# Patient Record
Sex: Male | Born: 1985 | Race: Black or African American | Hispanic: No | Marital: Single | Smoking: Former smoker
Health system: Southern US, Community
[De-identification: ages and names within clinical notes are randomized; demographics above are authoritative.]

## PROBLEM LIST (undated history)

## (undated) DIAGNOSIS — K5900 Constipation, unspecified: Secondary | ICD-10-CM

## (undated) HISTORY — PX: BIOPSY BOWEL: PRO7

## (undated) HISTORY — DX: Constipation, unspecified: K59.00

## (undated) HISTORY — PX: LYMPH NODE BIOPSY: SHX201

---

## 2008-02-11 ENCOUNTER — Ambulatory Visit (HOSPITAL_COMMUNITY): Admission: RE | Admit: 2008-02-11 | Discharge: 2008-02-11 | Payer: Self-pay | Admitting: Urology

## 2008-02-15 ENCOUNTER — Ambulatory Visit (HOSPITAL_COMMUNITY): Admission: RE | Admit: 2008-02-15 | Discharge: 2008-02-15 | Payer: Self-pay | Admitting: Urology

## 2008-02-25 ENCOUNTER — Ambulatory Visit (HOSPITAL_COMMUNITY): Admission: RE | Admit: 2008-02-25 | Discharge: 2008-02-25 | Payer: Self-pay | Admitting: Urology

## 2008-02-29 ENCOUNTER — Encounter (INDEPENDENT_AMBULATORY_CARE_PROVIDER_SITE_OTHER): Payer: Self-pay | Admitting: Interventional Radiology

## 2008-02-29 ENCOUNTER — Ambulatory Visit (HOSPITAL_COMMUNITY): Admission: RE | Admit: 2008-02-29 | Discharge: 2008-02-29 | Payer: Self-pay | Admitting: Urology

## 2008-03-08 ENCOUNTER — Ambulatory Visit (HOSPITAL_COMMUNITY): Payer: Self-pay | Admitting: Oncology

## 2008-03-26 ENCOUNTER — Ambulatory Visit (HOSPITAL_COMMUNITY): Admission: RE | Admit: 2008-03-26 | Discharge: 2008-03-26 | Payer: Self-pay | Admitting: Interventional Radiology

## 2008-04-26 ENCOUNTER — Encounter (HOSPITAL_COMMUNITY): Admission: RE | Admit: 2008-04-26 | Discharge: 2008-05-26 | Payer: Self-pay | Admitting: Oncology

## 2008-04-28 ENCOUNTER — Ambulatory Visit (HOSPITAL_COMMUNITY): Payer: Self-pay | Admitting: Oncology

## 2008-07-03 ENCOUNTER — Ambulatory Visit (HOSPITAL_COMMUNITY): Payer: Self-pay | Admitting: Oncology

## 2008-07-03 ENCOUNTER — Encounter (HOSPITAL_COMMUNITY): Admission: RE | Admit: 2008-07-03 | Discharge: 2008-07-26 | Payer: Self-pay | Admitting: Oncology

## 2008-07-09 ENCOUNTER — Emergency Department (HOSPITAL_COMMUNITY): Admission: EM | Admit: 2008-07-09 | Discharge: 2008-07-09 | Payer: Self-pay | Admitting: Emergency Medicine

## 2008-10-31 ENCOUNTER — Encounter (HOSPITAL_COMMUNITY): Admission: RE | Admit: 2008-10-31 | Discharge: 2008-11-30 | Payer: Self-pay | Admitting: Oncology

## 2008-11-01 ENCOUNTER — Ambulatory Visit (HOSPITAL_COMMUNITY): Admission: RE | Admit: 2008-11-01 | Discharge: 2008-11-01 | Payer: Self-pay | Admitting: Oncology

## 2008-11-03 ENCOUNTER — Ambulatory Visit (HOSPITAL_COMMUNITY): Payer: Self-pay | Admitting: Oncology

## 2008-11-07 ENCOUNTER — Ambulatory Visit: Payer: Self-pay | Admitting: Pulmonary Disease

## 2008-11-07 DIAGNOSIS — R599 Enlarged lymph nodes, unspecified: Secondary | ICD-10-CM | POA: Insufficient documentation

## 2008-11-08 ENCOUNTER — Ambulatory Visit: Payer: Self-pay | Admitting: Internal Medicine

## 2008-11-20 ENCOUNTER — Encounter: Payer: Self-pay | Admitting: Pulmonary Disease

## 2008-11-22 ENCOUNTER — Telehealth (INDEPENDENT_AMBULATORY_CARE_PROVIDER_SITE_OTHER): Payer: Self-pay | Admitting: *Deleted

## 2008-11-24 ENCOUNTER — Ambulatory Visit: Payer: Self-pay | Admitting: Pulmonary Disease

## 2008-11-24 DIAGNOSIS — F411 Generalized anxiety disorder: Secondary | ICD-10-CM

## 2008-11-24 DIAGNOSIS — J309 Allergic rhinitis, unspecified: Secondary | ICD-10-CM | POA: Insufficient documentation

## 2008-11-27 ENCOUNTER — Telehealth: Payer: Self-pay | Admitting: Pulmonary Disease

## 2008-12-19 ENCOUNTER — Telehealth (INDEPENDENT_AMBULATORY_CARE_PROVIDER_SITE_OTHER): Payer: Self-pay | Admitting: *Deleted

## 2009-03-26 ENCOUNTER — Telehealth: Payer: Self-pay | Admitting: Pulmonary Disease

## 2009-04-09 ENCOUNTER — Telehealth: Payer: Self-pay | Admitting: Pulmonary Disease

## 2009-04-13 ENCOUNTER — Ambulatory Visit: Payer: Self-pay | Admitting: Internal Medicine

## 2010-08-18 ENCOUNTER — Encounter (HOSPITAL_COMMUNITY): Payer: Self-pay | Admitting: Oncology

## 2010-09-16 IMAGING — CT CT CHEST W/ CM
2 of 4 series · 15 of 36 positions shown, 18 images · IV contrast (agent unspecified)
Comparison: CT abdomen pelvis 11/01/2008

CLINICAL DATA: Cough, chest pain and shortness of breath with
exertion.  Mediastinal adenopathy.

CT CHEST WITH CONTRAST
TECHNIQUE: Multidetector CT imaging of the chest was performed
following the standard protocol during bolus administration of
intravenous contrast.
Contrast: 80 ml [id number]

[Series 2: chest routine with · axial · 0.78mm/px · z∈[-343,-48]mm · 12 of 71 slices shown, 15 images]
[im 6/71  mediastinal]
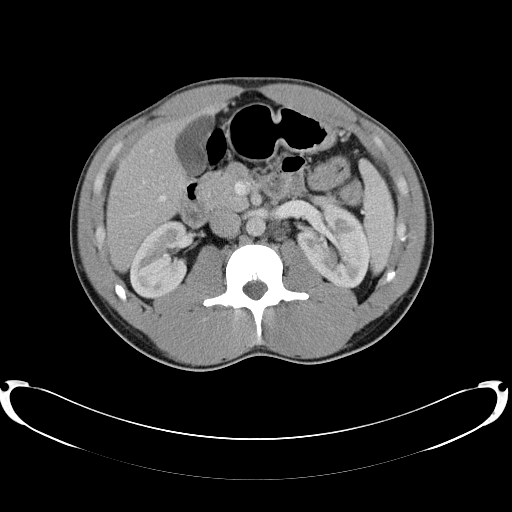
[im 6/71  lung]
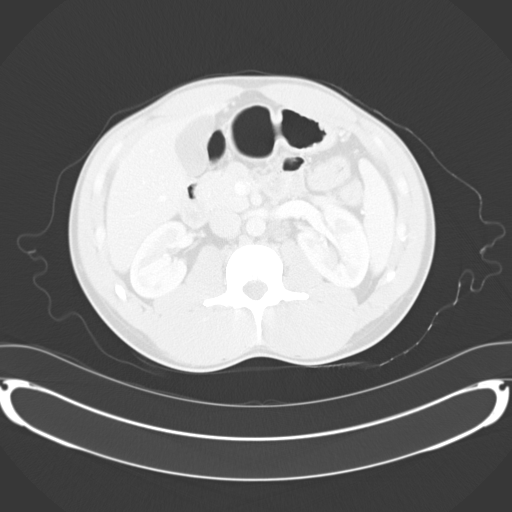
[im 11/71  lung]
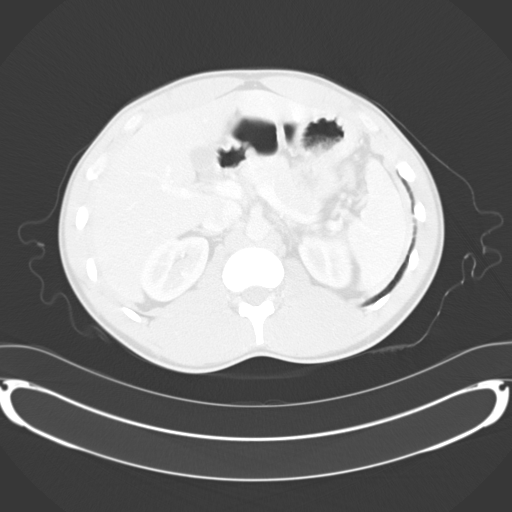
[im 17/71  lung]
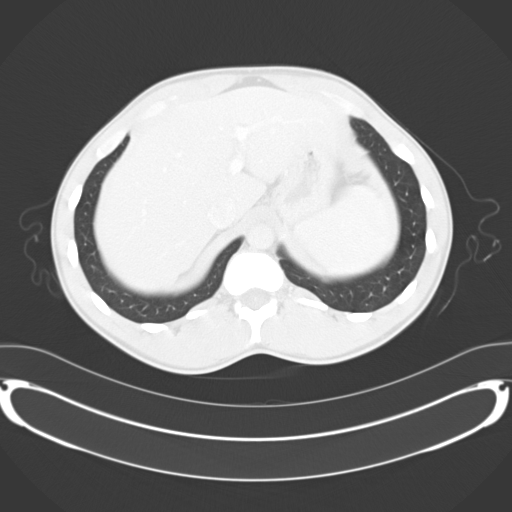
[im 22/71  lung]
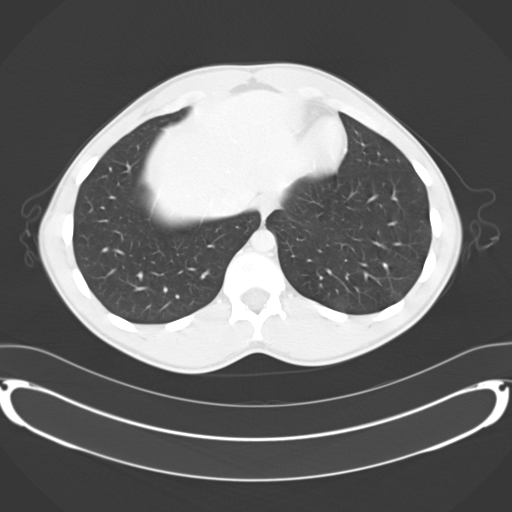
[im 27/71  mediastinal]
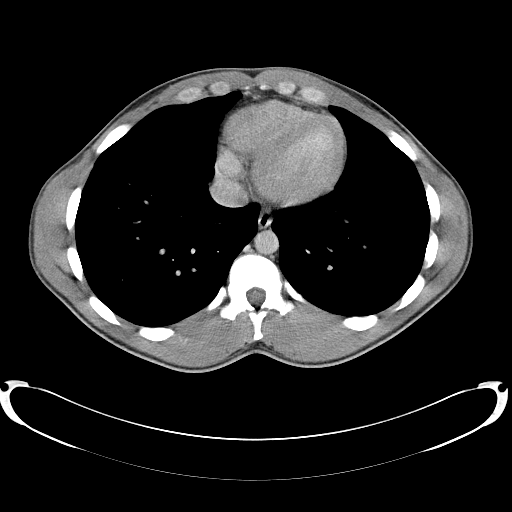
[im 27/71  lung]
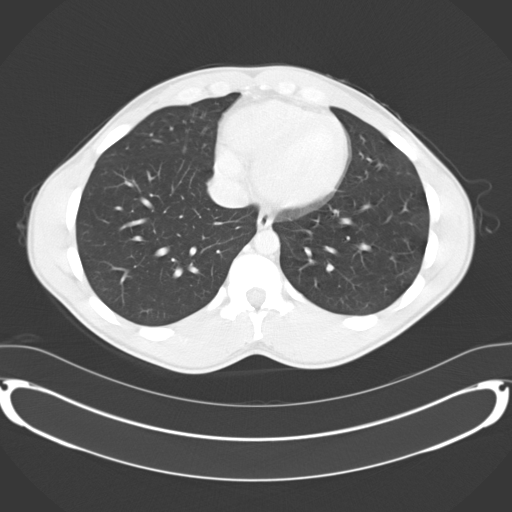
[im 33/71  lung]
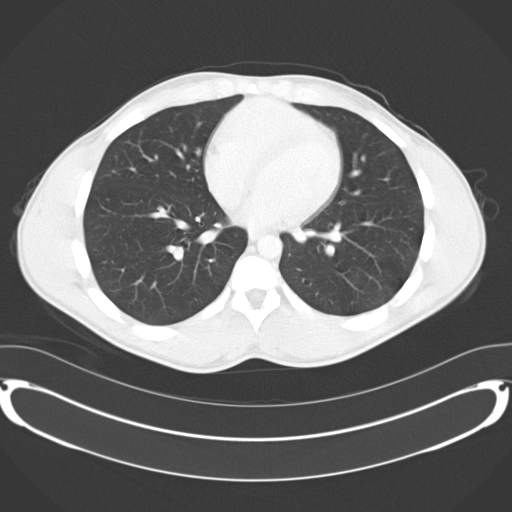
[im 38/71  lung]
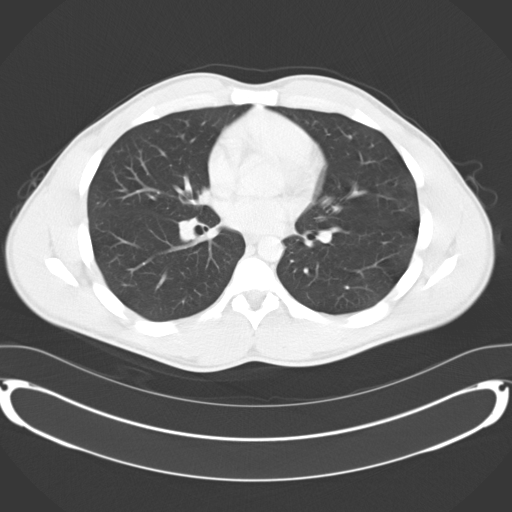
[im 44/71  lung]
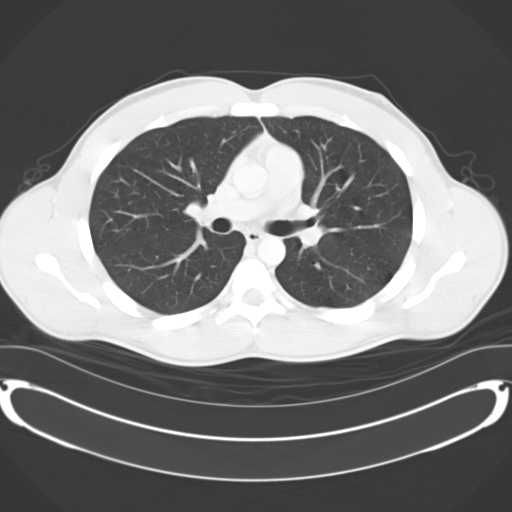
[im 49/71  mediastinal]
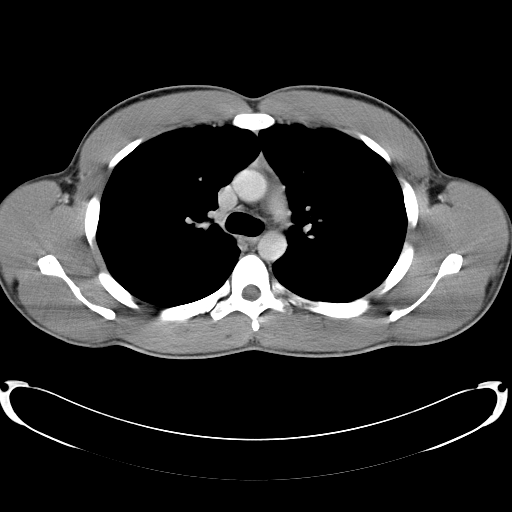
[im 49/71  lung]
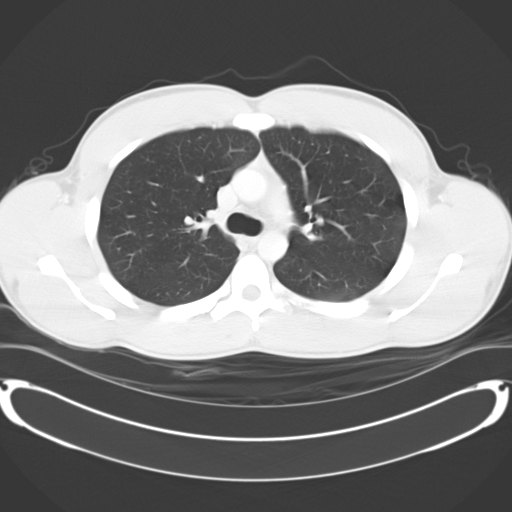
[im 54/71  lung]
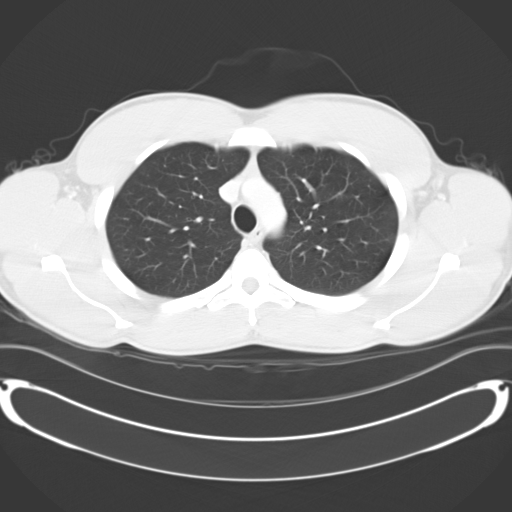
[im 60/71  lung]
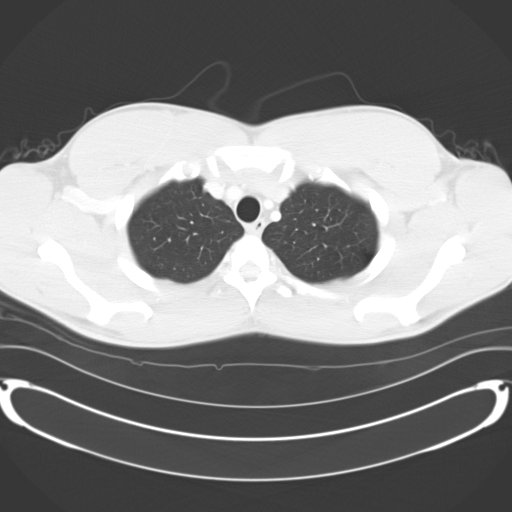
[im 65/71  lung]
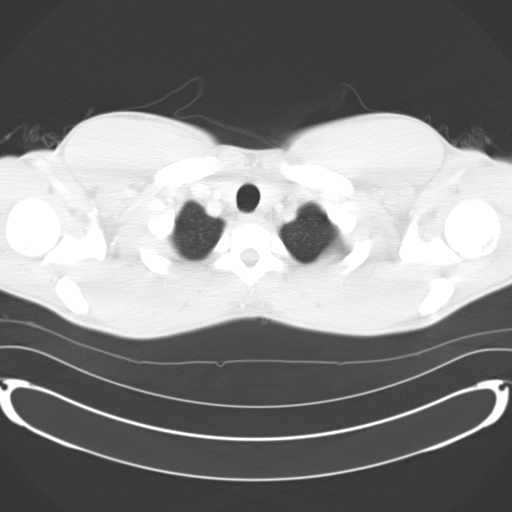

[Series 602: <mpr range> · coronal · 0.78mm/px · 3 of 120 slices shown]
[im 24/120  lung]
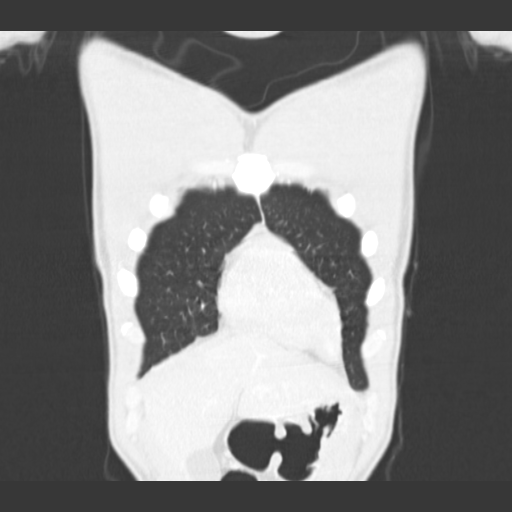
[im 48/120  lung]
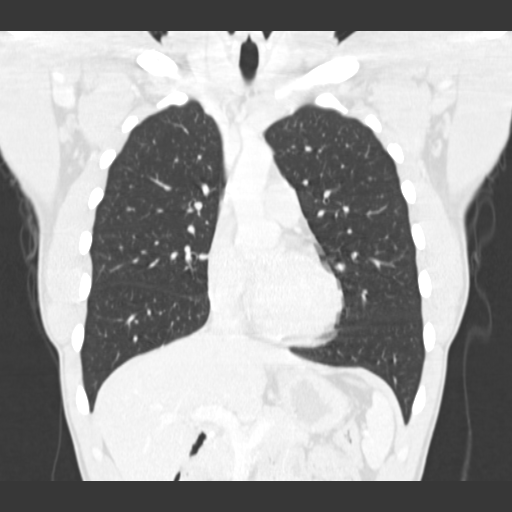
[im 72/120  lung]
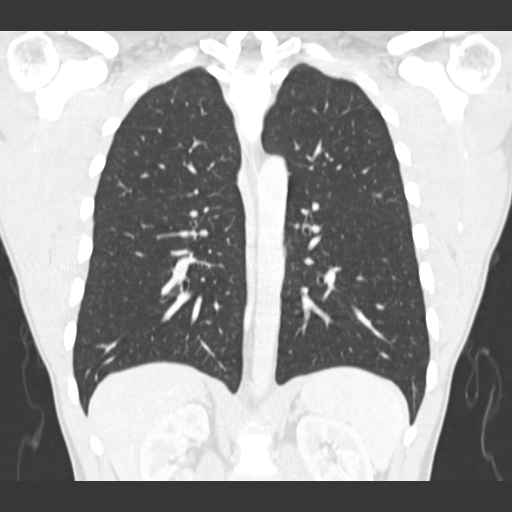

[15 of 36 positions shown; findings below may reference images not displayed]

FINDINGS: No pathologically enlarged mediastinal, hilar or axillary
lymph nodes.  Heart size normal.  No pericardial effusion.

Lungs are clear.  No pleural fluid.  Airway is unremarkable.

Incidental imaging of the upper abdomen shows small lymph nodes.
No worrisome lytic or sclerotic lesions.
IMPRESSION: No acute findings in the chest.

## 2010-11-06 LAB — COMPREHENSIVE METABOLIC PANEL
AST: 25 U/L (ref 0–37)
BUN: 9 mg/dL (ref 6–23)
CO2: 27 mEq/L (ref 19–32)
Calcium: 9.3 mg/dL (ref 8.4–10.5)
Chloride: 102 mEq/L (ref 96–112)
Creatinine, Ser: 1.11 mg/dL (ref 0.4–1.5)
GFR calc non Af Amer: >60 mL/min (ref >60)
Glucose, Bld: 99 mg/dL (ref 70–99)
Total Bilirubin: 1.1 mg/dL (ref 0.3–1.2)

## 2010-11-06 LAB — CBC
HCT: 39.7 % (ref 39.0–52.0)
Hemoglobin: 13.6 g/dL (ref 13.0–17.0)
MCHC: 34.4 g/dL (ref 30.0–36.0)
MCV: 88.7 fL (ref 78.0–100.0)
RBC: 4.47 MIL/uL (ref 4.22–5.81)
WBC: 4 10*3/uL (ref 4.0–10.5)

## 2010-11-06 LAB — DIFFERENTIAL
Basophils Absolute: 0 10*3/uL (ref 0.0–0.1)
Eosinophils Relative: 2 % (ref 0–5)
Lymphocytes Relative: 45 % (ref 12–46)
Neutro Abs: 1.7 10*3/uL (ref 1.7–7.7)
Neutrophils Relative %: 44 % (ref 43–77)

## 2010-11-06 LAB — LACTATE DEHYDROGENASE: LDH: 114 U/L (ref 94–250)

## 2010-12-10 NOTE — Op Note (Signed)
NAMERAEDEN, SCHIPPERS               ACCOUNT NO.:  [id number]   MEDICAL RECORD NO.:  [id number]          PATIENT TYPE:  AMB   LOCATION:  DAY                           FACILITY:  [facility]   PHYSICIAN:  Ky Barban, M.D.DATE OF BIRTH:  1986-03-05   DATE OF PROCEDURE:  DATE OF DISCHARGE:                               OPERATIVE REPORT   PREOPERATIVE DIAGNOSIS:  Gross hematuria.   POSTOPERATIVE DIAGNOSIS:  Posterior urethritis.   PROCEDURE:  Cystoscopy.   ANESTHESIA:  General.   PROCEDURE:  The patient under general endotracheal anesthesia in  lithotomy position after the usual prep and drape, a #25 cystoscope is  introduced under direct vision. Anterior urethra looks normal, posterior  urethra is open, and it is friable, looks erythematous.  Bladder is  smooth, no tumor, stone, foreign body, or inflammation.  Cystoscope was  removed.  The patient left the operating room in satisfactory condition.      Ky Barban, M.D.  Electronically Signed     MIJ/MEDQ  D:  02/25/2008  T:  02/26/2008  Job:  [id number]

## 2011-04-25 LAB — URINALYSIS, ROUTINE W REFLEX MICROSCOPIC
Bilirubin Urine: NEGATIVE
Nitrite: NEGATIVE
Specific Gravity, Urine: 1.02
Urobilinogen, UA: 0.2

## 2011-04-25 LAB — CBC
HCT: 42.9
MCV: 91.5
Platelets: 182
RDW: 12.3

## 2011-04-25 LAB — HEMOGLOBIN AND HEMATOCRIT, BLOOD: HCT: 39.7

## 2011-05-02 LAB — COMPREHENSIVE METABOLIC PANEL
Alkaline Phosphatase: 72 U/L (ref 39–117)
BUN: 10 mg/dL (ref 6–23)
Calcium: 9.1 mg/dL (ref 8.4–10.5)
Glucose, Bld: 106 mg/dL — ABNORMAL HIGH (ref 70–99)
Potassium: 3.9 mEq/L (ref 3.5–5.1)
Total Protein: 7 g/dL (ref 6.0–8.3)

## 2011-05-02 LAB — LACTATE DEHYDROGENASE: LDH: 96 U/L (ref 94–250)

## 2011-05-02 LAB — CBC
HCT: 40.6 % (ref 39.0–52.0)
Hemoglobin: 13.8 g/dL (ref 13.0–17.0)
MCHC: 33.9 g/dL (ref 30.0–36.0)
RDW: 11.8 % (ref 11.5–15.5)

## 2011-05-02 LAB — DIFFERENTIAL
Basophils Relative: 0 % (ref 0–1)
Monocytes Relative: 7 % (ref 3–12)
Neutro Abs: 1.9 10*3/uL (ref 1.7–7.7)
Neutrophils Relative %: 44 % (ref 43–77)

## 2012-05-27 ENCOUNTER — Encounter (HOSPITAL_COMMUNITY): Payer: Self-pay | Admitting: Emergency Medicine

## 2012-05-27 ENCOUNTER — Emergency Department (HOSPITAL_COMMUNITY)
Admission: EM | Admit: 2012-05-27 | Discharge: 2012-05-27 | Disposition: A | Discharge status: Home / Self Care | Payer: BC Managed Care – PPO | Attending: Emergency Medicine | Admitting: Emergency Medicine

## 2012-05-27 DIAGNOSIS — W261XXA Contact with sword or dagger, initial encounter: Secondary | ICD-10-CM | POA: Insufficient documentation

## 2012-05-27 DIAGNOSIS — W260XXA Contact with knife, initial encounter: Secondary | ICD-10-CM | POA: Insufficient documentation

## 2012-05-27 DIAGNOSIS — S61209A Unspecified open wound of unspecified finger without damage to nail, initial encounter: Secondary | ICD-10-CM | POA: Insufficient documentation

## 2012-05-27 DIAGNOSIS — Y9389 Activity, other specified: Secondary | ICD-10-CM | POA: Insufficient documentation

## 2012-05-27 DIAGNOSIS — Y92009 Unspecified place in unspecified non-institutional (private) residence as the place of occurrence of the external cause: Secondary | ICD-10-CM | POA: Insufficient documentation

## 2012-05-27 DIAGNOSIS — IMO0002 Reserved for concepts with insufficient information to code with codable children: Secondary | ICD-10-CM

## 2012-05-27 DIAGNOSIS — F17200 Nicotine dependence, unspecified, uncomplicated: Secondary | ICD-10-CM | POA: Insufficient documentation

## 2012-05-27 NOTE — ED Notes (Signed)
Discharge instructions reviewed with pt, questions answered. Pt verbalized understanding.  

## 2012-05-27 NOTE — ED Provider Notes (Signed)
History     CSN: [id number]  Arrival date & time 05/27/12  0118   First MD Initiated Contact with Patient 05/27/12 0204      Chief Complaint  Patient presents with  . Laceration    (Consider location/radiation/quality/duration/timing/severity/associated sxs/prior treatment) HPI  William Griffith is a 26 y.o. male who presents to the Emergency Department complaining of laceration to middle finger right hand as a result of slipping with a knife while cutting steak. Bandaged at home, bleeding stopped. He hit the finger and bleeding restarted.  History reviewed. No pertinent past medical history.  Past Surgical History  Procedure Date  . Biopsy bowel     No family history on file.  History  Substance Use Topics  . Smoking status: Current Every Day Smoker  . Smokeless tobacco: Not on file  . Alcohol Use: No      Review of Systems  Constitutional: Negative for fever.       10 Systems reviewed and are negative for acute change except as noted in the HPI.  HENT: Negative for congestion.   Eyes: Negative for discharge and redness.  Respiratory: Negative for cough and shortness of breath.   Cardiovascular: Negative for chest pain.  Gastrointestinal: Negative for vomiting and abdominal pain.  Musculoskeletal: Negative for back pain.  Skin: Negative for rash.       Laceration to right middle finger  Neurological: Negative for syncope, numbness and headaches.  Psychiatric/Behavioral:       No behavior change.    Allergies  Hydrocodone and Morphine  Home Medications  No current outpatient prescriptions on file.  BP 133/65  Pulse 75  Temp 97.9 F (36.6 C) (Oral)  Resp 18  Ht 5\' 7"  (1.702 m)  Wt 155 lb (70.308 kg)  BMI 24.28 kg/m2  SpO2 100%  Physical Exam  Nursing note and vitals reviewed. Constitutional: He appears well-developed and well-nourished.       Awake, alert, nontoxic appearance.  HENT:  Head: Atraumatic.  Eyes: Right eye exhibits no discharge.  Left eye exhibits no discharge.  Neck: Neck supple.  Pulmonary/Chest: Effort normal and breath sounds normal. He exhibits no tenderness.  Abdominal: Soft. There is no tenderness. There is no rebound.  Musculoskeletal: He exhibits no tenderness.       Baseline ROM, no obvious new focal weakness.  Neurological:       Mental status and motor strength appears baseline for patient and situation.  Skin: No rash noted.       2 cm slice laceration to tip of right middle finger. Shallow, does not require sutures.  Psychiatric: He has a normal mood and affect.    ED Course  Procedures (including critical care time)      MDM  *Patient with laceration to right middle finger. Steri strips and dressing applied. Pt stable in ED with no significant deterioration in condition.The patient appears reasonably screened and/or stabilized for discharge and I doubt any other medical condition or other [facility] requiring further screening, evaluation, or treatment in the ED at this time prior to discharge.  MDM Reviewed: nursing note and vitals           Nicoletta Dress. Colon Branch, MD 05/27/12 [phone]

## 2012-05-27 NOTE — ED Notes (Signed)
Patient states was cutting steak earlier tonight and cut his right middle finger.

## 2016-12-30 ENCOUNTER — Ambulatory Visit: Payer: BLUE CROSS/BLUE SHIELD | Admitting: Family Medicine

## 2017-04-13 ENCOUNTER — Ambulatory Visit: Payer: BLUE CROSS/BLUE SHIELD | Admitting: Family Medicine

## 2017-04-22 ENCOUNTER — Encounter: Payer: Self-pay | Admitting: Family Medicine

## 2020-01-04 ENCOUNTER — Encounter: Payer: Self-pay | Admitting: Internal Medicine

## 2020-03-08 ENCOUNTER — Other Ambulatory Visit: Payer: Self-pay

## 2020-03-08 ENCOUNTER — Encounter: Payer: Self-pay | Admitting: Gastroenterology

## 2020-03-08 ENCOUNTER — Ambulatory Visit: Payer: 59 | Admitting: Gastroenterology

## 2020-03-08 DIAGNOSIS — K5900 Constipation, unspecified: Secondary | ICD-10-CM

## 2020-03-08 DIAGNOSIS — K219 Gastro-esophageal reflux disease without esophagitis: Secondary | ICD-10-CM

## 2020-03-08 NOTE — Progress Notes (Signed)
Primary Care Physician:  Ignatius Specking, MD  Referring Physician: Dr. Sherril Croon Primary Gastroenterologist:  Dr. Jena Gauss  Chief Complaint  Patient presents with  . Constipation  . Gastroesophageal Reflux  . Bloated    HPI:   William Griffith is a 34 y.o. male presenting today at the request of Dr. Sherril Croon due to constipation and GERD.     Chronic constipation dating back to childhood. Noted more prominent last year. Stool is hard. Before drinking more water and herbal tea (senna, peppermint) takes only when needed, would have BM once to twice per week. Now 3-4 more times per week, hard at times. 50% of the time straining. About a month ago was really bulky and had to sit for a long time. Scant streaks of blood on tissue with wiping, sometimes on stool if bulky. No abdominal pain. Associated bloating. Fibrous foods cause more bloating.    Hasn't taken herbal tea in a few weeks. This week has been having good BMs.   Reflux moreso when bloated. Will burp a lot and feels trapped. No nausea/vomiting. No OTC agents. No caffeine, carbonated drinks. Stopped sodas since last year. More bloating with greasy foods. No unexplained lack of appetite. Occasional nausea depending on what he eats. Gets nauseated sometimes when constipated.   Wonders about prominence between rib cage. No pain.     Past Medical History:  Diagnosis Date  . Constipation     Past Surgical History:  Procedure Laterality Date  . LYMPH NODE BIOPSY      Current Outpatient Medications  Medication Sig Dispense Refill  . Multiple Vitamins-Minerals (CENTRUM PO) Take by mouth as needed.    Marland Kitchen OVER THE COUNTER MEDICATION Vitamin C, Vitamin D, Zinc 1 tablet daily    . OVER THE COUNTER MEDICATION Herbal tea as needed for constipaton     No current facility-administered medications for this visit.    Allergies as of 03/08/2020 - Review Complete 03/08/2020  Allergen Reaction Noted  . Hydrocodone    . Morphine      Family  History  Problem Relation Age of Onset  . Colon cancer Neg Hx   . Colon polyps Neg Hx     Social History   Socioeconomic History  . Marital status: Single    Spouse name: Not on file  . Number of children: Not on file  . Years of education: Not on file  . Highest education level: Not on file  Occupational History  . Occupation: Social worker  Tobacco Use  . Smoking status: Former Games developer  . Smokeless tobacco: Never Used  Substance and Sexual Activity  . Alcohol use: Yes    Comment: occ beer  . Drug use: No  . Sexual activity: Not on file  Other Topics Concern  . Not on file  Social History Narrative  . Not on file   Social Determinants of Health   Financial Resource Strain:   . Difficulty of Paying Living Expenses:   Food Insecurity:   . Worried About Programme researcher, broadcasting/film/video in the Last Year:   . Barista in the Last Year:   Transportation Needs:   . Freight forwarder (Medical):   Marland Kitchen Lack of Transportation (Non-Medical):   Physical Activity:   . Days of Exercise per Week:   . Minutes of Exercise per Session:   Stress:   . Feeling of Stress :   Social Connections:   . Frequency of Communication with Friends  and Family:   . Frequency of Social Gatherings with Friends and Family:   . Attends Religious Services:   . Active Member of Clubs or Organizations:   . Attends Banker Meetings:   Marland Kitchen Marital Status:   Intimate Partner Violence:   . Fear of Current or Ex-Partner:   . Emotionally Abused:   Marland Kitchen Physically Abused:   . Sexually Abused:     Review of Systems: Gen: Denies any fever, chills, fatigue, weight loss, lack of appetite.  CV: Denies chest pain, heart palpitations, peripheral edema, syncope.  Resp: Denies shortness of breath at rest or with exertion. Denies wheezing or cough.  GI: see HPI GU : Denies urinary burning, urinary frequency, urinary hesitancy MS: Denies joint pain, muscle weakness, cramps, or limitation of movement.    Derm: Denies rash, itching, dry skin Psych: Denies depression, anxiety, memory loss, and confusion Heme: Denies bruising, bleeding, and enlarged lymph nodes.  Physical Exam: BP 127/76   Pulse 73   Temp (!) 97.5 F (36.4 C) (Temporal)   Ht 5\' 7"  (1.702 m)   Wt 164 lb 9.6 oz (74.7 kg)   BMI 25.78 kg/m  General:   Alert and oriented. Pleasant and cooperative. Well-nourished and well-developed.  Head:  Normocephalic and atraumatic. Eyes:  Without icterus, sclera clear and conjunctiva pink.  Ears:  Normal auditory acuity. Mouth:  No deformity or lesions, oral mucosa pink.  Lungs:  Clear to auscultation bilaterally.  Heart:  S1, S2 present without murmurs appreciated.  Abdomen:  +BS, soft, non-tender and non-distended. No HSM noted. No guarding or rebound. No masses appreciated.  Rectal:  Deferred  Msk:  Symmetrical without gross deformities. Normal posture. Extremities:  Without edema. Neurologic:  Alert and  oriented x4;  grossly normal neurologically. Skin:  Intact without significant lesions or rashes. Psych:  Alert and cooperative. Normal mood and affect.  ASSESSMENT: William Griffith is a 34 y.o. male presenting today with constipation, bloating, and mild reflux symptoms at the request of Dr. 31.  Constipation has responded well to herbal teas in the past; we discussed switching to fiber instead and avoiding herbal supplements. Will trial Benefiber daily. I have also provided Linzess 72 mcg to trial if no improvement with fiber, as constipation is not severe. He did note scant hematochezia with wiping after large BM; we discussed colonoscopy if persistent.   Reflux occasions are rare and dietary-related; he has not required any OTC agents and no alarm signs/symptoms. Holding off on medication unless persistent despite changes.   His concern regarding prominence below rib cage is his xiphoid process, and he was reassured today regarding this and shown illustrations.     PLAN:  Benefiber daily  Linzess 72 mcg samples provided in case no improvement with Benefiber  GERD diet discussed  Return in 4 months  Sherril Croon, PhD, [facility]

## 2020-03-08 NOTE — Patient Instructions (Addendum)
For constipation: let's start Benefiber 2 teaspoons each morning. You can increase to three times a day if needed. Start slow with dosing. If you do not have improvement with bowel movements, I have given you samples of Linzess. You can take this 30 minutes before breakfast (on an empty stomach to avoid diarrhea). Sometimes you can have loose stool a few days when starting out, but it usually resolves. If not, let me know. You may be fine with just fiber supplementation.  We will hold off on any reflux medications. For a flare, you can take Pepcid over-the-counter. If you see a pattern with this despite avoiding trigger foods, we may need to do a medication. I have included a handout for you.  If you notice increased bleeding, any rectal pain, etc., please call.  We will see you in 4 months!   It was a pleasure to see you today. I want to create trusting relationships with patients to provide genuine, compassionate, and quality care. I value your feedback. If you receive a survey regarding your visit,  I greatly appreciate you taking time to fill this out.   Gelene Mink, PhD, ANP-BC [facility]    Gastroesophageal Reflux Disease, Adult Gastroesophageal reflux (GER) happens when acid from the stomach flows up into the tube that connects the mouth and the stomach (esophagus). Normally, food travels down the esophagus and stays in the stomach to be digested. However, when a person has GER, food and stomach acid sometimes move back up into the esophagus. If this becomes a more serious problem, the person may be diagnosed with a disease called gastroesophageal reflux disease (GERD). GERD occurs when the reflux:  Happens often.  Causes frequent or severe symptoms.  Causes problems such as damage to the esophagus. When stomach acid comes in contact with the esophagus, the acid may cause soreness (inflammation) in the esophagus. Over time, GERD may create small holes (ulcers) in the  lining of the esophagus. What are the causes? This condition is caused by a problem with the muscle between the esophagus and the stomach (lower esophageal sphincter, or LES). Normally, the LES muscle closes after food passes through the esophagus to the stomach. When the LES is weakened or abnormal, it does not close properly, and that allows food and stomach acid to go back up into the esophagus. The LES can be weakened by certain dietary substances, medicines, and medical conditions, including:  Tobacco use.  Pregnancy.  Having a hiatal hernia.  Alcohol use.  Certain foods and beverages, such as coffee, chocolate, onions, and peppermint. What increases the risk? You are more likely to develop this condition if you:  Have an increased body weight.  Have a connective tissue disorder.  Use NSAID medicines. What are the signs or symptoms? Symptoms of this condition include:  Heartburn.  Difficult or painful swallowing.  The feeling of having a lump in the throat.  Abitter taste in the mouth.  Bad breath.  Having a large amount of saliva.  Having an upset or bloated stomach.  Belching.  Chest pain. Different conditions can cause chest pain. Make sure you see your health care provider if you experience chest pain.  Shortness of breath or wheezing.  Ongoing (chronic) cough or a night-time cough.  Wearing away of tooth enamel.  Weight loss. How is this diagnosed? Your health care provider will take a medical history and perform a physical exam. To determine if you have mild or severe GERD, your health care  provider may also monitor how you respond to treatment. You may also have tests, including:  A test to examine your stomach and esophagus with a small camera (endoscopy).  A test thatmeasures the acidity level in your esophagus.  A test thatmeasures how much pressure is on your esophagus.  A barium swallow or modified barium swallow test to show the shape,  size, and functioning of your esophagus. How is this treated? The goal of treatment is to help relieve your symptoms and to prevent complications. Treatment for this condition may vary depending on how severe your symptoms are. Your health care provider may recommend:  Changes to your diet.  Medicine.  Surgery. Follow these instructions at home: Eating and drinking   Follow a diet as recommended by your health care provider. This may involve avoiding foods and drinks such as: ? Coffee and tea (with or without caffeine). ? Drinks that containalcohol. ? Energy drinks and sports drinks. ? Carbonated drinks or sodas. ? Chocolate and cocoa. ? Peppermint and mint flavorings. ? Garlic and onions. ? Horseradish. ? Spicy and acidic foods, including peppers, chili powder, curry powder, vinegar, hot sauces, and barbecue sauce. ? Citrus fruit juices and citrus fruits, such as oranges, lemons, and limes. ? Tomato-based foods, such as red sauce, chili, salsa, and pizza with red sauce. ? Fried and fatty foods, such as donuts, french fries, potato chips, and high-fat dressings. ? High-fat meats, such as hot dogs and fatty cuts of red and white meats, such as rib eye steak, sausage, ham, and bacon. ? High-fat dairy items, such as whole milk, butter, and cream cheese.  Eat small, frequent meals instead of large meals.  Avoid drinking large amounts of liquid with your meals.  Avoid eating meals during the 2-3 hours before bedtime.  Avoid lying down right after you eat.  Do not exercise right after you eat. Lifestyle   Do not use any products that contain nicotine or tobacco, such as cigarettes, e-cigarettes, and chewing tobacco. If you need help quitting, ask your health care provider.  Try to reduce your stress by using methods such as yoga or meditation. If you need help reducing stress, ask your health care provider.  If you are overweight, reduce your weight to an amount that is  healthy for you. Ask your health care provider for guidance about a safe weight loss goal. General instructions  Pay attention to any changes in your symptoms.  Take over-the-counter and prescription medicines only as told by your health care provider. Do not take aspirin, ibuprofen, or other NSAIDs unless your health care provider told you to do so.  Wear loose-fitting clothing. Do not wear anything tight around your waist that causes pressure on your abdomen.  Raise (elevate) the head of your bed about 6 inches (15 cm).  Avoid bending over if this makes your symptoms worse.  Keep all follow-up visits as told by your health care provider. This is important. Contact a health care provider if:  You have: ? New symptoms. ? Unexplained weight loss. ? Difficulty swallowing or it hurts to swallow. ? Wheezing or a persistent cough. ? A hoarse voice.  Your symptoms do not improve with treatment. Get help right away if you:  Have pain in your arms, neck, jaw, teeth, or back.  Feel sweaty, dizzy, or light-headed.  Have chest pain or shortness of breath.  Vomit and your vomit looks like blood or coffee grounds.  Faint.  Have stool that is bloody or  black.  Cannot swallow, drink, or eat. Summary  Gastroesophageal reflux happens when acid from the stomach flows up into the esophagus. GERD is a disease in which the reflux happens often, causes frequent or severe symptoms, or causes problems such as damage to the esophagus.  Treatment for this condition may vary depending on how severe your symptoms are. Your health care provider may recommend diet and lifestyle changes, medicine, or surgery.  Contact a health care provider if you have new or worsening symptoms.  Take over-the-counter and prescription medicines only as told by your health care provider. Do not take aspirin, ibuprofen, or other NSAIDs unless your health care provider told you to do so.  Keep all follow-up visits as  told by your health care provider. This is important. This information is not intended to replace advice given to you by your health care provider. Make sure you discuss any questions you have with your health care provider. Document Revised: 01/20/2018 Document Reviewed: 01/20/2018 Elsevier Patient Education  2020 [employer].

## 2020-07-10 ENCOUNTER — Ambulatory Visit: Payer: 59 | Admitting: Gastroenterology

## 2020-07-10 ENCOUNTER — Encounter: Payer: Self-pay | Admitting: Internal Medicine

## 2020-07-10 NOTE — Progress Notes (Deleted)
History of chroni constipation, recommending Benefiber at last visit. Linzess 72 mcg also provided to trial if no improvement with fiber.

## 2022-03-22 ENCOUNTER — Other Ambulatory Visit: Payer: Self-pay

## 2022-03-22 ENCOUNTER — Ambulatory Visit
Admission: EM | Admit: 2022-03-22 | Discharge: 2022-03-22 | Disposition: A | Discharge status: Home / Self Care | Payer: BC Managed Care – PPO | Attending: Family Medicine | Admitting: Family Medicine

## 2022-03-22 ENCOUNTER — Encounter: Payer: Self-pay | Admitting: Emergency Medicine

## 2022-03-22 DIAGNOSIS — R55 Syncope and collapse: Secondary | ICD-10-CM | POA: Diagnosis not present

## 2022-03-22 LAB — POCT URINALYSIS DIP (MANUAL ENTRY)
Bilirubin, UA: NEGATIVE
Blood, UA: NEGATIVE
Glucose, UA: NEGATIVE mg/dL
Leukocytes, UA: NEGATIVE
Nitrite, UA: NEGATIVE
Protein Ur, POC: 30 mg/dL — AB
Spec Grav, UA: 1.02 (ref 1.010–1.025)
Urobilinogen, UA: 1 E.U./dL
pH, UA: 7 (ref 5.0–8.0)

## 2022-03-22 LAB — POCT FASTING CBG KUC MANUAL ENTRY: POCT Glucose (KUC): 126 mg/dL — AB (ref 70–99)

## 2022-03-22 NOTE — ED Notes (Signed)
Urine cup at bedside. Pt reports unable to void at this time. Ice water provided by RT.

## 2022-03-22 NOTE — ED Triage Notes (Addendum)
Pt reports woke up this am and reports got in the shower and reports brought leg up and reports "felt like was going to pass out." Pt reports "tunnel vision" in the shower,walked out of shower, and sat down and then reports laid down for a few minutes. Denies syncope or hitting head.    Pt reports more frequent BM and intermittent dizziness this week. Pt also reports tongue feels dry in triage.   Pt alert, ambulates with steady gait. Denies any symptoms at this time.reports history of similar.

## 2022-03-22 NOTE — ED Provider Notes (Signed)
[facility]    CSN: [id number] Arrival date & time: 03/22/22  8676      History   Chief Complaint Chief Complaint  Patient presents with   Weakness   HPI William Griffith is a 36 y.o. male.   Patient presenting today for evaluation of an episode that occurred this morning while he was taking a shower where he started getting "tunnel vision "and felt like he might pass out.  He states he was able to recognize the episode and get out of the shower and onto the floor without passing out or falling and states after sitting down for about a minute or so he returned back to his baseline health.  He denies full syncope, head injury, vomiting, diaphoresis, chest pain, shortness of breath.  He states he was overall feeling completely fine before getting in the shower after waking up this morning.  When asked, he does endorse that he has not eaten or drank anything since about 7:30 AM yesterday after arriving home from a business trip.  He states he was under a lot of stress and slept most of the day.  He currently feels at baseline health and denies any complaints at this time.  Has not tried anything over-the-counter for symptoms.  Did have a little bit of Pedialyte prior to coming to clinic today.  Of note, he states he has had several episodes similar to this over the past few years, sometimes when going to the bathroom, other times at random moments such as after eating.  Does have a primary care provider and plans to see them for his annual physical next month.  No known past history of cardiac or neurologic issues.    Past Medical History:  Diagnosis Date   Constipation     Patient Active Problem List   Diagnosis Date Noted   Constipation 03/08/2020   GERD (gastroesophageal reflux disease) 03/08/2020   GENERALIZED ANXIETY DISORDER 11/24/2008   ALLERGIC  RHINITIS 11/24/2008   LYMPHADENOPATHY 11/07/2008    Past Surgical History:  Procedure Laterality Date   LYMPH NODE  BIOPSY         Home Medications    Prior to Admission medications   Medication Sig Start Date End Date Taking? Authorizing Provider  Multiple Vitamins-Minerals (CENTRUM PO) Take by mouth as needed.    [provider]  OVER THE COUNTER MEDICATION Vitamin C, Vitamin D, Zinc 1 tablet daily    [provider]  OVER THE COUNTER MEDICATION Herbal tea as needed for constipaton    [provider]    Family History Family History  Problem Relation Age of Onset   Colon cancer Neg Hx    Colon polyps Neg Hx     Social History Social History   Tobacco Use   Smoking status: Former   Smokeless tobacco: Never  Substance Use Topics   Alcohol use: Yes    Comment: occ beer   Drug use: No     Allergies   Hydrocodone and Morphine   Review of Systems Review of Systems Per HPI  Physical Exam Triage Vital Signs ED Triage Vitals  Enc Vitals Group     BP 03/22/22 0845 132/84     Pulse Rate 03/22/22 0845 89     Resp 03/22/22 0845 20     Temp 03/22/22 0845 99 F (37.2 C)     Temp Source 03/22/22 0845 Oral     SpO2 03/22/22 0845 98 %  Weight --      Height --      Head Circumference --      Peak Flow --      Pain Score 03/22/22 0846 0     Pain Loc --      Pain Edu? --      Excl. in GC? --    Orthostatic VS for the past 24 hrs:  BP- Lying Pulse- Lying BP- Sitting Pulse- Sitting BP- Standing at 0 minutes Pulse- Standing at 0 minutes  03/22/22 0859 160/76 90 167/70 101 167/79 104    Updated Vital Signs BP 132/84 (BP Location: Right Arm)   Pulse 89   Temp 99 F (37.2 C) (Oral)   Resp 20   SpO2 98%   Visual Acuity Right Eye Distance:   Left Eye Distance:   Bilateral Distance:    Right Eye Near:   Left Eye Near:    Bilateral Near:     Physical Exam Vitals and nursing note reviewed.  Constitutional:      Appearance: Normal appearance.  HENT:     Head: Atraumatic.     Mouth/Throat:     Mouth: Mucous membranes are moist.  Eyes:      Extraocular Movements: Extraocular movements intact.     Conjunctiva/sclera: Conjunctivae normal.     Pupils: Pupils are equal, round, and reactive to light.  Cardiovascular:     Rate and Rhythm: Normal rate and regular rhythm.     Heart sounds: Normal heart sounds.  Pulmonary:     Effort: Pulmonary effort is normal.     Breath sounds: Normal breath sounds.  Musculoskeletal:        General: Normal range of motion.     Cervical back: Normal range of motion and neck supple.  Skin:    General: Skin is warm and dry.  Neurological:     General: No focal deficit present.     Mental Status: He is oriented to person, place, and time.     Cranial Nerves: No cranial nerve deficit.     Motor: No weakness.     Gait: Gait normal.  Psychiatric:        Mood and Affect: Mood normal.        Thought Content: Thought content normal.        Judgment: Judgment normal.      UC Treatments / Results  Labs (all labs ordered are listed, but only abnormal results are displayed) Labs Reviewed  POCT FASTING CBG [facility] MANUAL ENTRY - Abnormal; Notable for the following components:      Result Value   POCT Glucose ([facility]) 126 (*)    All other components within normal limits  POCT URINALYSIS DIP (MANUAL ENTRY) - Abnormal; Notable for the following components:   Ketones, POC UA small (15) (*)    Protein Ur, POC =30 (*)    All other components within normal limits  CBC WITH DIFFERENTIAL/PLATELET  COMPREHENSIVE METABOLIC PANEL  TSH    EKG   Radiology No results found.  Procedures Procedures (including critical care time)  Medications Ordered in UC Medications - No data to display  Initial Impression / Assessment and Plan / UC Course  I have reviewed the triage vital signs and the nursing notes.  Pertinent labs & imaging results that were available during my care of the patient were reviewed by me and considered in my medical decision making (see chart for details).     Vital signs benign and  reassuring today, urinalysis showing some mild dehydration likely due to his lack of p.o. intake for the last 24 hours, point-of-care glucose 126 today likely mildly elevated due to Pedialyte prior to arrival today.  His EKG showing normal sinus rhythm at 83 bpm without acute ST or T wave changes.  Exam completely benign without any abnormal or concerning findings.  Labs pending for further rule out.  Likely vasovagal episode but discussed that to go the emergency department if his symptoms recur or worsen at any time.  Follow-up with PCP for reevaluation and consideration for referrals if felt necessary.  45 minutes spent today in direct patient care, evaluation, education.   Final Clinical Impressions(s) / UC Diagnoses   Final diagnoses:  Pre-syncope   Discharge Instructions   None    ED Prescriptions   None    PDMP not reviewed this encounter.   Particia Nearing, [place] 03/22/22 1322

## 2022-03-23 LAB — CBC WITH DIFFERENTIAL/PLATELET
Basophils Absolute: 0 10*3/uL (ref 0.0–0.2)
Basos: 1 %
EOS (ABSOLUTE): 0 10*3/uL (ref 0.0–0.4)
Eos: 0 %
Hematocrit: 43.2 % (ref 37.5–51.0)
Hemoglobin: 14.3 g/dL (ref 13.0–17.7)
Immature Grans (Abs): 0 10*3/uL (ref 0.0–0.1)
Immature Granulocytes: 1 %
Lymphocytes Absolute: 0.9 10*3/uL (ref 0.7–3.1)
Lymphs: 14 %
MCH: 29.3 pg (ref 26.6–33.0)
MCHC: 33.1 g/dL (ref 31.5–35.7)
MCV: 89 fL (ref 79–97)
Monocytes Absolute: 0.4 10*3/uL (ref 0.1–0.9)
Monocytes: 6 %
Neutrophils Absolute: 4.8 10*3/uL (ref 1.4–7.0)
Neutrophils: 78 %
Platelets: 167 10*3/uL (ref 150–450)
RBC: 4.88 x10E6/uL (ref 4.14–5.80)
RDW: 11.9 % (ref 11.6–15.4)
WBC: 6.2 10*3/uL (ref 3.4–10.8)

## 2022-03-23 LAB — COMPREHENSIVE METABOLIC PANEL
ALT: 38 IU/L (ref 0–44)
AST: 23 IU/L (ref 0–40)
Albumin/Globulin Ratio: 1.7 (ref 1.2–2.2)
Albumin: 4.5 g/dL (ref 4.1–5.1)
Alkaline Phosphatase: 49 IU/L (ref 44–121)
BUN/Creatinine Ratio: 11 (ref 9–20)
BUN: 13 mg/dL (ref 6–20)
Bilirubin Total: 0.7 mg/dL (ref 0.0–1.2)
CO2: 24 mmol/L (ref 20–29)
Calcium: 9.3 mg/dL (ref 8.7–10.2)
Chloride: 101 mmol/L (ref 96–106)
Creatinine, Ser: 1.17 mg/dL (ref 0.76–1.27)
Globulin, Total: 2.7 g/dL (ref 1.5–4.5)
Glucose: 111 mg/dL — ABNORMAL HIGH (ref 70–99)
Potassium: 4.2 mmol/L (ref 3.5–5.2)
Sodium: 139 mmol/L (ref 134–144)
Total Protein: 7.2 g/dL (ref 6.0–8.5)
eGFR: 82 mL/min/{1.73_m2} (ref >59)

## 2022-03-23 LAB — TSH: TSH: 0.639 u[IU]/mL (ref 0.450–4.500)
